# Patient Record
Sex: Female | Born: 1975 | Race: White | Hispanic: No | Marital: Married
Health system: Midwestern US, Community
[De-identification: ages and names within clinical notes are randomized; demographics above are authoritative.]

## PROBLEM LIST (undated history)

## (undated) DIAGNOSIS — E119 Type 2 diabetes mellitus without complications: Secondary | ICD-10-CM

## (undated) HISTORY — PX: BREAST SURGERY: SHX581

---

## 2015-07-19 ENCOUNTER — Emergency Department (HOSPITAL_COMMUNITY)
Admission: EM | Admit: 2015-07-19 | Discharge: 2015-07-20 | Disposition: A | Discharge status: Home / Self Care | Payer: BLUE CROSS/BLUE SHIELD | Attending: Emergency Medicine | Admitting: Emergency Medicine

## 2015-07-19 ENCOUNTER — Encounter (HOSPITAL_COMMUNITY): Payer: Self-pay | Admitting: Emergency Medicine

## 2015-07-19 DIAGNOSIS — Z3202 Encounter for pregnancy test, result negative: Secondary | ICD-10-CM | POA: Diagnosis not present

## 2015-07-19 DIAGNOSIS — R6883 Chills (without fever): Secondary | ICD-10-CM | POA: Insufficient documentation

## 2015-07-19 DIAGNOSIS — R51 Headache: Secondary | ICD-10-CM | POA: Diagnosis present

## 2015-07-19 DIAGNOSIS — R112 Nausea with vomiting, unspecified: Secondary | ICD-10-CM | POA: Diagnosis not present

## 2015-07-19 DIAGNOSIS — E119 Type 2 diabetes mellitus without complications: Secondary | ICD-10-CM | POA: Insufficient documentation

## 2015-07-19 DIAGNOSIS — R519 Headache, unspecified: Secondary | ICD-10-CM

## 2015-07-19 HISTORY — DX: Type 2 diabetes mellitus without complications: E11.9

## 2015-07-19 LAB — COMPREHENSIVE METABOLIC PANEL
ALBUMIN: 3.5 g/dL (ref 3.5–5.0)
ALT: 15 U/L (ref 14–54)
ANION GAP: 12 (ref 5–15)
AST: 24 U/L (ref 15–41)
Alkaline Phosphatase: 86 U/L (ref 38–126)
BILIRUBIN TOTAL: 0.7 mg/dL (ref 0.3–1.2)
BUN: 12 mg/dL (ref 6–20)
CO2: 24 mmol/L (ref 22–32)
Calcium: 8.5 mg/dL — ABNORMAL LOW (ref 8.9–10.3)
Chloride: 101 mmol/L (ref 101–111)
Creatinine, Ser: 1.26 mg/dL — ABNORMAL HIGH (ref 0.44–1.00)
GFR calc Af Amer: >60 mL/min (ref >60)
GFR calc non Af Amer: 53 mL/min — ABNORMAL LOW (ref >60)
GLUCOSE: 120 mg/dL — AB (ref 65–99)
POTASSIUM: 3.3 mmol/L — AB (ref 3.5–5.1)
Sodium: 137 mmol/L (ref 135–145)
TOTAL PROTEIN: 7.7 g/dL (ref 6.5–8.1)

## 2015-07-19 LAB — I-STAT BETA HCG BLOOD, ED (MC, WL, AP ONLY): I-stat hCG, quantitative: 8.9 m[IU]/mL — ABNORMAL HIGH (ref <5)

## 2015-07-19 LAB — CBC
HEMATOCRIT: 34.4 % — AB (ref 36.0–46.0)
HEMOGLOBIN: 11.3 g/dL — AB (ref 12.0–15.0)
MCH: 26.2 pg (ref 26.0–34.0)
MCHC: 32.8 g/dL (ref 30.0–36.0)
MCV: 79.8 fL (ref 78.0–100.0)
Platelets: 217 10*3/uL (ref 150–400)
RBC: 4.31 MIL/uL (ref 3.87–5.11)
RDW: 13.8 % (ref 11.5–15.5)
WBC: 19.1 10*3/uL — AB (ref 4.0–10.5)

## 2015-07-19 LAB — LIPASE, BLOOD: LIPASE: 14 U/L — AB (ref 22–51)

## 2015-07-19 NOTE — ED Provider Notes (Signed)
CSN: [id number]     Arrival date & time 07/19/15  2016 History  This chart was scribed for Terry Wurm, MD by Octavia Heir, ED Scribe. This patient was seen in room WA23/WA23 and the patient's care was started at 12:32 AM.    No chief complaint on file.     Patient is a 39 y.o. female presenting with headaches. The history is provided by the patient. No language interpreter was used.  Headache Pain location:  Frontal Quality:  Dull Radiates to:  Does not radiate Onset quality:  Gradual Timing:  Constant Progression:  Unchanged Context: not activity, not coughing and not straining   Relieved by:  Nothing Worsened by:  Nothing Ineffective treatments:  None tried Associated symptoms: nausea and vomiting   Associated symptoms: no abdominal pain, no congestion, no diarrhea, no dizziness, no facial pain, no fatigue, no focal weakness, no myalgias, no neck pain, no neck stiffness, no numbness, no paresthesias, no photophobia, no seizures, no sinus pressure, no sore throat, no URI and no weakness   Risk factors: does not have insomnia and lifestyle not sedentary    HPI Comments: Terry Dominguez is a 39 y.o. female who has a hx of DM presents to the Emergency Department complaining of multiple complaints onset 5 days ago. Pt has associated intermittent, gradual worsening nausea, headache and vomiting about 15 times since yesterday.  Pt notes her the first day of her last menstrual cycle was 11 days ago. Pt denies congestion, itchy/watery eyes, rhinorrhea, sick contacts, travel, abdominal pain, urinary frequency, constipation, diarrhea, dysuria, new medications, nor tick exposure.  No documented fevers no changes in vision, speech or cognition   Past Medical History  Diagnosis Date  . Diabetes mellitus without complication    Past Surgical History  Procedure Laterality Date  . Cesarean section    . Breast surgery      Reduction   No family history on file. Social History  Substance Use  Topics  . Smoking status: Never Smoker   . Smokeless tobacco: None  . Alcohol Use: No   OB History    No data available     Review of Systems  Constitutional: Positive for chills. Negative for fatigue.  HENT: Negative for congestion, rhinorrhea, sinus pressure, sore throat and voice change.   Eyes: Negative for photophobia, itching and visual disturbance.  Gastrointestinal: Positive for nausea and vomiting. Negative for abdominal pain, diarrhea, constipation and blood in stool.  Genitourinary: Negative for dysuria and frequency.  Musculoskeletal: Negative for myalgias, neck pain and neck stiffness.  Neurological: Positive for headaches. Negative for dizziness, focal weakness, seizures, facial asymmetry, weakness, numbness and paresthesias.  All other systems reviewed and are negative.     Allergies  Review of patient's allergies indicates no known allergies.  Home Medications   Prior to Admission medications   Not on File   Triage vitals: BP 112/65 mmHg  Pulse 107  Temp(Src) 98.2 F (36.8 C) (Oral)  Resp 20  SpO2 99%  LMP 06/18/2015 Physical Exam  Constitutional: She is oriented to person, place, and time. She appears well-developed and well-nourished. No distress.  Well appearing sitting in the room comfortably with all the lights on  HENT:  Head: Normocephalic.  Mouth/Throat: Oropharynx is clear and moist. No oropharyngeal exudate.  Eyes: Conjunctivae and EOM are normal. Pupils are equal, round, and reactive to light. No scleral icterus.  Neck: Normal range of motion. Neck supple. No thyromegaly present.  Trachea midline, no lymphadenopathy, no meningismus  Cardiovascular: Normal rate, regular rhythm and intact distal pulses.  Exam reveals no gallop and no friction rub.   No murmur heard. Pulmonary/Chest: Effort normal and breath sounds normal. No respiratory distress. She has no wheezes. She has no rales.  Lungs clear  Abdominal: Soft. She exhibits no distension  and no mass. Bowel sounds are increased. There is no tenderness. There is no rigidity, no rebound, no guarding, no tenderness at McBurney's point and negative Murphy's sign. No hernia.  Hyperactive bowel sounds throughout  Musculoskeletal: Normal range of motion.  Lymphadenopathy:    She has no cervical adenopathy.  Neurological: She is alert and oriented to person, place, and time. She has normal reflexes. She displays normal reflexes. No cranial nerve deficit. She exhibits normal muscle tone. Coordination normal.  Intact cognition  Skin: Skin is warm and dry. No rash noted.  Psychiatric: She has a normal mood and affect. Her behavior is normal.  Nursing note and vitals reviewed.   ED Course  Procedures  DIAGNOSTIC STUDIES: Oxygen Saturation is 99% on RA, normal by my interpretation.  COORDINATION OF CARE:  12:35 AM Discussed treatment plan which includes lab work with pt at bedside and pt agreed to plan.  Labs Review Labs Reviewed  LIPASE, BLOOD - Abnormal; Notable for the following:    Lipase 14 (*)    All other components within normal limits  COMPREHENSIVE METABOLIC PANEL - Abnormal; Notable for the following:    Potassium 3.3 (*)    Glucose, Bld 120 (*)    Creatinine, Ser 1.26 (*)    Calcium 8.5 (*)    GFR calc non Af Amer 53 (*)    All other components within normal limits  CBC - Abnormal; Notable for the following:    WBC 19.1 (*)    Hemoglobin 11.3 (*)    HCT 34.4 (*)    All other components within normal limits  I-STAT BETA HCG BLOOD, ED ([facility], [facility], [facility] ONLY) - Abnormal; Notable for the following:    I-stat hCG, quantitative 8.9 (*)    All other components within normal limits    Imaging Review No results found. I have personally reviewed and evaluated these images and lab results as part of my medical decision-making.   EKG Interpretation None      MDM   Final diagnoses:  None    White count likely to vomiting > 15 times, demargination.  There is no  pain in abdomen.  No indication for CT.  No fevers, no travel or tick exposure.  No documented fevers symptoms x several days. Intact cognition and cranial nerves normal CT of the head.  Highly doubt acute intracranial pathology.    I personally performed the services described in this documentation, which was scribed in my presence. The recorded information has been reviewed and is accurate.    Cy Blamer, MD 07/20/15 [phone]

## 2015-07-19 NOTE — ED Notes (Signed)
Pt c/o intermittent fever, N/V emesis x12 in 24 hours, HA, dizziness. Denies diarrhea.

## 2015-07-19 NOTE — ED Notes (Signed)
Pt c/o n/v/dfever/chills since Monday 8/15.  No diarrhea.  She currently has a headache rated 7/10 and described as throbbing over her right eye.  Pt is currently resting quietly in bed with family at the bedside.  RN explained the delay and offered services; pt denied any current needs. No acute distress noted at this time.

## 2015-07-20 ENCOUNTER — Emergency Department (HOSPITAL_COMMUNITY): Payer: BLUE CROSS/BLUE SHIELD

## 2015-07-20 ENCOUNTER — Encounter (HOSPITAL_COMMUNITY): Payer: Self-pay | Admitting: Emergency Medicine

## 2015-07-20 LAB — URINE MICROSCOPIC-ADD ON

## 2015-07-20 LAB — URINALYSIS, ROUTINE W REFLEX MICROSCOPIC
BILIRUBIN URINE: NEGATIVE
Glucose, UA: NEGATIVE mg/dL
NITRITE: NEGATIVE
Protein, ur: 30 mg/dL — AB
Specific Gravity, Urine: 1.018 (ref 1.005–1.030)
UROBILINOGEN UA: 1 mg/dL (ref 0.0–1.0)
pH: 6.5 (ref 5.0–8.0)

## 2015-07-20 LAB — HCG, QUANTITATIVE, PREGNANCY

## 2015-07-20 MED ORDER — METOCLOPRAMIDE HCL 5 MG/ML IJ SOLN
10.0000 mg | Freq: Once | INTRAMUSCULAR | Status: AC
  Administered 2015-07-20: 10 mg via INTRAVENOUS
  Filled 2015-07-20: qty 2

## 2015-07-20 MED ORDER — METOCLOPRAMIDE HCL 10 MG PO TABS: 10.0000 mg | ORAL_TABLET | Freq: Four times a day (QID) | ORAL | Status: DC

## 2015-07-20 MED ORDER — SODIUM CHLORIDE 0.9 % IV BOLUS (SEPSIS)
1000.0000 mL | Freq: Once | INTRAVENOUS | Status: AC
  Administered 2015-07-20: 1000 mL via INTRAVENOUS

## 2015-07-20 MED ORDER — KETOROLAC TROMETHAMINE 30 MG/ML IJ SOLN
30.0000 mg | Freq: Once | INTRAMUSCULAR | Status: AC
  Administered 2015-07-20: 30 mg via INTRAVENOUS
  Filled 2015-07-20: qty 1

## 2015-07-20 MED ORDER — SUCRALFATE 1 GM/10ML PO SUSP: 1.0000 g | Freq: Three times a day (TID) | ORAL | Status: DC

## 2015-07-20 NOTE — ED Notes (Signed)
Pt states she feels some better.

## 2015-07-20 NOTE — ED Notes (Signed)
Pt states she feels better.

## 2015-07-20 NOTE — ED Notes (Signed)
Room temperature ginger ale given per MD request

## 2016-02-10 IMAGING — CR DG ABDOMEN ACUTE W/ 1V CHEST
3 series · 3 of 3 positions shown · non-contrast
Comparison: None.

CLINICAL DATA: Acute onset of intermittent fever, nausea and
vomiting. Headache and dizziness. Initial encounter.

EXAM:
DG ABDOMEN ACUTE W/ 1V CHEST

[w chest pa]
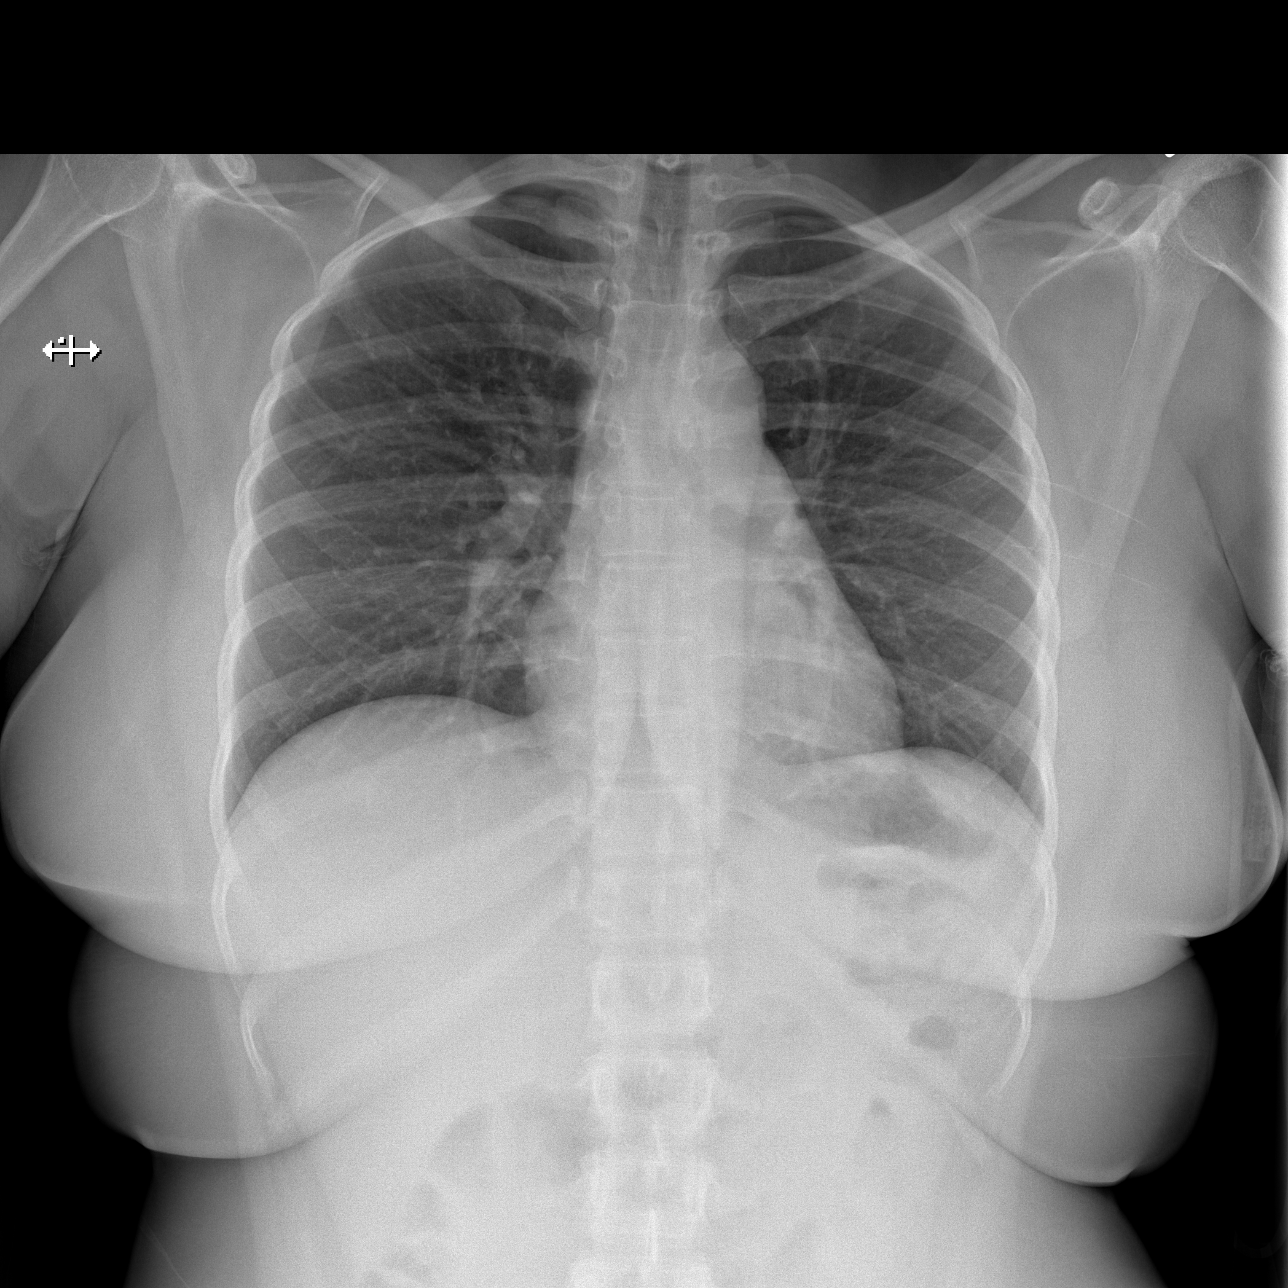

[w abdomen upright]
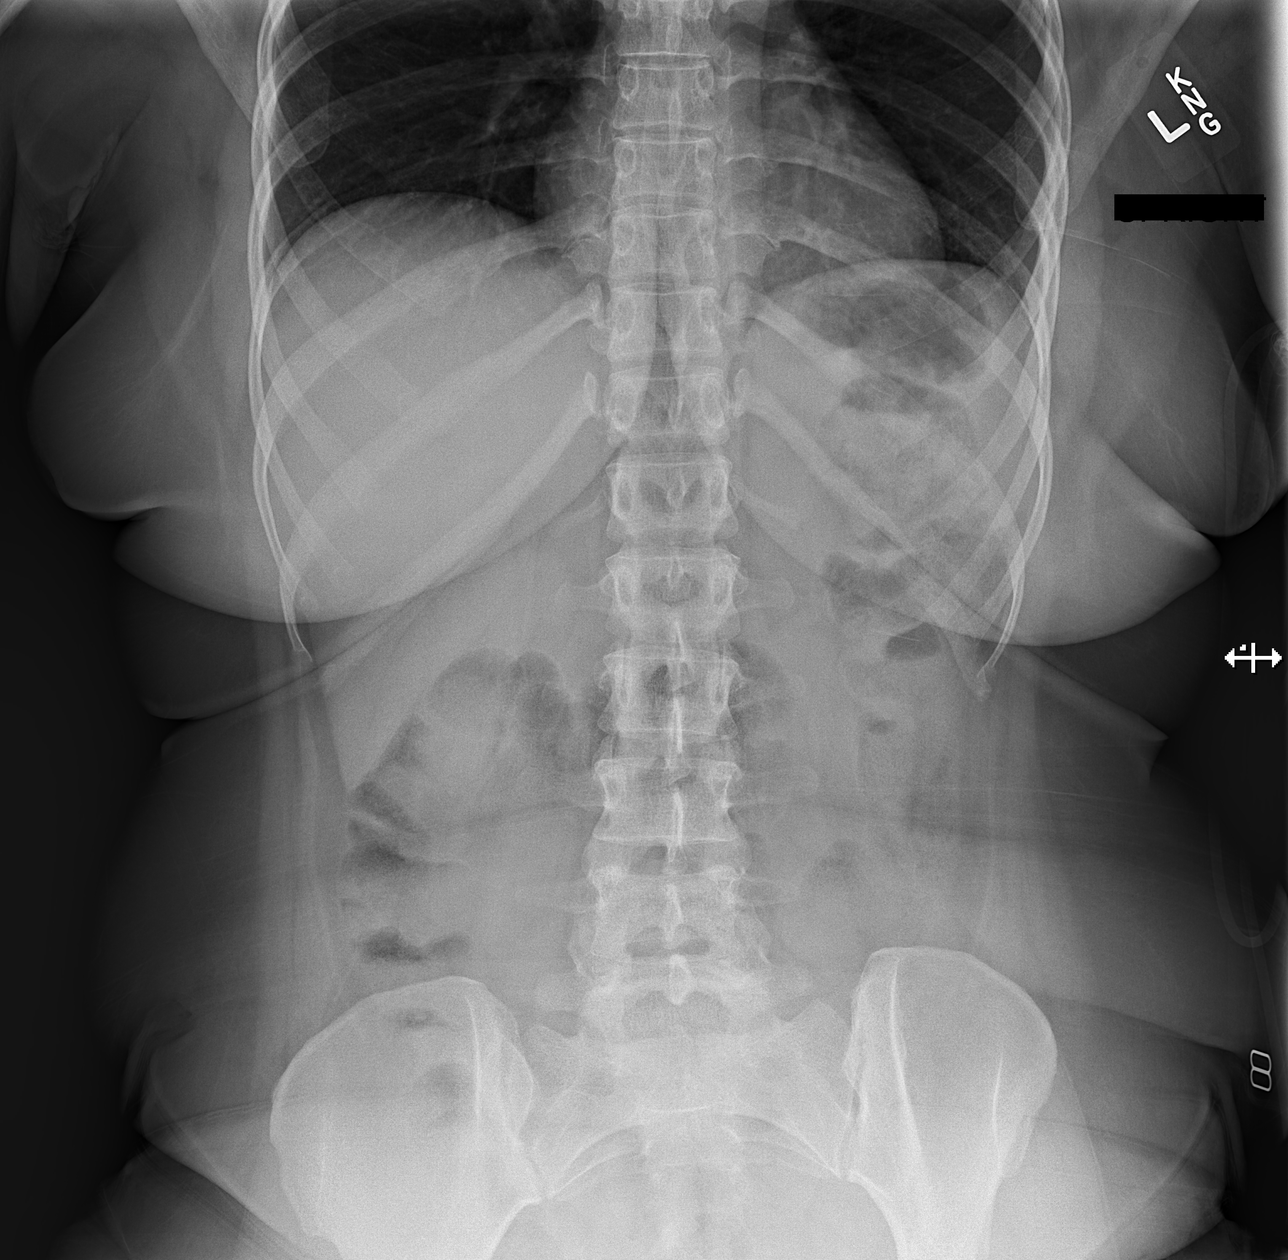

[t abdomen supine]
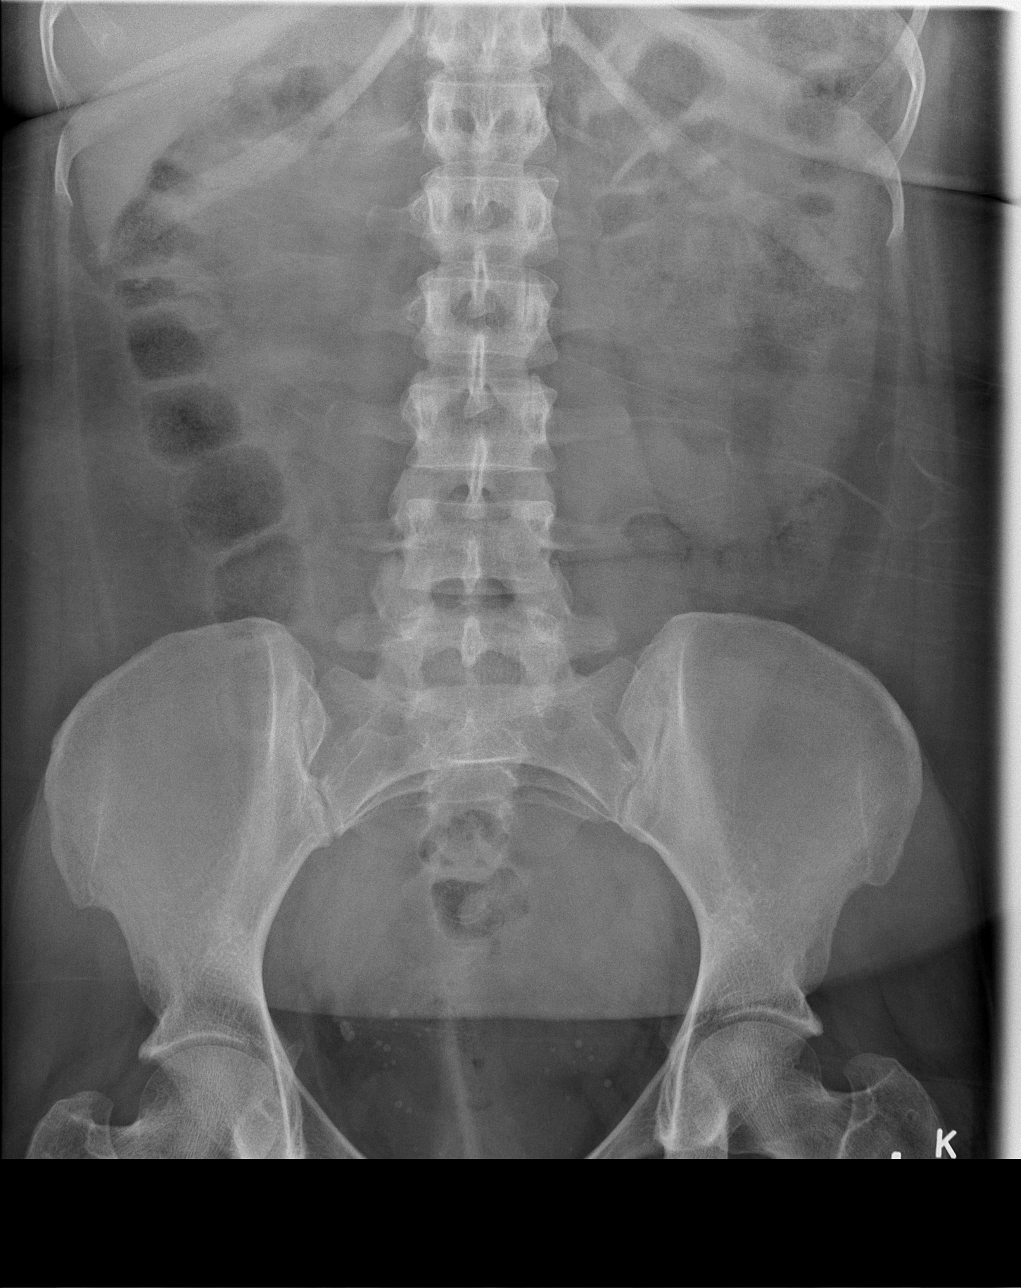

[3 of 3 positions shown; findings below may reference images not displayed]

FINDINGS: The lungs are well-aerated and clear. There is no evidence of focal
opacification, pleural effusion or pneumothorax. The
cardiomediastinal silhouette is within normal limits.

The visualized bowel gas pattern is unremarkable. Scattered stool
and air are seen within the colon; there is no evidence of small
bowel dilatation to suggest obstruction. No free intra-abdominal air
is identified on the provided upright view.

No acute osseous abnormalities are seen; the sacroiliac joints are
unremarkable in appearance.
IMPRESSION: 1. Unremarkable bowel gas pattern; no free intra-abdominal air seen.
Small to moderate amount of stool noted in the colon.
2. No acute cardiopulmonary process seen.

## 2016-03-02 NOTE — Telephone Encounter (Signed)
S-RPh is calling for patient requesting refill.  B-She is looking for Valtrex 1000 mg 4 x a day.   A-I checked the patient's chart and she has never gotten a Valtrex Rx from this office. I notified the RPh and she said the Rx came from another physician. She will notify the patient.   R-Call PCP within 24 hours.   Reason for Disposition  ??? Caller requesting a NON-URGENT new prescription or refill and triager unable to refill per unit policy    Protocols used: MEDICATION QUESTION CALL-ADULT-AH

## 2016-03-02 NOTE — Telephone Encounter (Signed)
Name of Caller: Shanda BumpsJessica    Relationship to Patient:pharmacist     Contact phone number: [phone]    Medication Name: Valtrex 1000 mg 4 a day     Ordering Physician: Lambert KetoBrinkman, Murray    Pharmacy Name:  [facility] - [place], [place] - [address] [place]- P [phone] F [phone]    Pharmacy Address:  see above    Pharmacy Phone Number: see above     Updated/Validated Preferred Pharmacy: Yes    Patient instructed to contact the pharmacy prior to picking up the medication: No    Patient advised that office/PCP has 24-48 business hours to return their call: No    Sent to nurse since pharmacist stated it was an urgent request

## 2016-10-26 ENCOUNTER — Encounter (HOSPITAL_COMMUNITY): Payer: Self-pay | Admitting: Emergency Medicine

## 2016-10-26 ENCOUNTER — Emergency Department (HOSPITAL_COMMUNITY)
Admission: EM | Admit: 2016-10-26 | Discharge: 2016-10-26 | Disposition: A | Discharge status: Home / Self Care | Payer: BLUE CROSS/BLUE SHIELD | Attending: Emergency Medicine | Admitting: Emergency Medicine

## 2016-10-26 DIAGNOSIS — Z79899 Other long term (current) drug therapy: Secondary | ICD-10-CM | POA: Insufficient documentation

## 2016-10-26 DIAGNOSIS — E1165 Type 2 diabetes mellitus with hyperglycemia: Secondary | ICD-10-CM | POA: Diagnosis not present

## 2016-10-26 DIAGNOSIS — R739 Hyperglycemia, unspecified: Secondary | ICD-10-CM

## 2016-10-26 LAB — BASIC METABOLIC PANEL
ANION GAP: 8 (ref 5–15)
BUN: 13 mg/dL (ref 6–20)
CALCIUM: 9.2 mg/dL (ref 8.9–10.3)
CO2: 23 mmol/L (ref 22–32)
CREATININE: 0.96 mg/dL (ref 0.44–1.00)
Chloride: 104 mmol/L (ref 101–111)
Glucose, Bld: 392 mg/dL — ABNORMAL HIGH (ref 65–99)
Potassium: 4 mmol/L (ref 3.5–5.1)
SODIUM: 135 mmol/L (ref 135–145)

## 2016-10-26 LAB — URINALYSIS, ROUTINE W REFLEX MICROSCOPIC
BILIRUBIN URINE: NEGATIVE
Glucose, UA: >1000 mg/dL — AB
HGB URINE DIPSTICK: NEGATIVE
Ketones, ur: NEGATIVE mg/dL
Leukocytes, UA: NEGATIVE
NITRITE: NEGATIVE
PROTEIN: NEGATIVE mg/dL
Specific Gravity, Urine: 1.033 — ABNORMAL HIGH (ref 1.005–1.030)
pH: 5.5 (ref 5.0–8.0)

## 2016-10-26 LAB — URINE MICROSCOPIC-ADD ON: RBC / HPF: NONE SEEN RBC/hpf (ref 0–5)

## 2016-10-26 LAB — CBC
HCT: 35.1 % — ABNORMAL LOW (ref 36.0–46.0)
HEMOGLOBIN: 11.1 g/dL — AB (ref 12.0–15.0)
MCH: 25.8 pg — AB (ref 26.0–34.0)
MCHC: 31.6 g/dL (ref 30.0–36.0)
MCV: 81.6 fL (ref 78.0–100.0)
PLATELETS: 290 10*3/uL (ref 150–400)
RBC: 4.3 MIL/uL (ref 3.87–5.11)
RDW: 13.8 % (ref 11.5–15.5)
WBC: 8.6 10*3/uL (ref 4.0–10.5)

## 2016-10-26 LAB — CBG MONITORING, ED
GLUCOSE-CAPILLARY: 387 mg/dL — AB (ref 65–99)
GLUCOSE-CAPILLARY: 150 mg/dL — AB (ref 65–99)

## 2016-10-26 MED ORDER — INSULIN ASPART 100 UNIT/ML ~~LOC~~ SOLN
9.0000 [IU] | Freq: Once | SUBCUTANEOUS | Status: AC
  Administered 2016-10-26: 9 [IU] via SUBCUTANEOUS
  Filled 2016-10-26: qty 1

## 2016-10-26 MED ORDER — GLIPIZIDE 5 MG PO TABS: 5.0000 mg | ORAL_TABLET | Freq: Every day | ORAL | 0 refills | Status: AC

## 2016-10-26 MED ORDER — SODIUM CHLORIDE 0.9 % IV BOLUS (SEPSIS)
1000.0000 mL | Freq: Once | INTRAVENOUS | Status: AC
  Administered 2016-10-26: 1000 mL via INTRAVENOUS

## 2016-10-26 MED ORDER — INSULIN ASPART 100 UNIT/ML IV SOLN: 9.0000 [IU] | Freq: Once | INTRAVENOUS | Status: DC

## 2016-10-26 NOTE — ED Provider Notes (Signed)
[facility]-EMERGENCY DEPT Provider Note   CSN: [id number] Arrival date & time: 10/26/16  0850     History   Chief Complaint Chief Complaint  Patient presents with  . Hyperglycemia    HPI Terry Dominguez is a 40 y.o. female who presents with hyperglycemia. Past medical history significant for type 2 diabetes with history of DKA. She states that she was first diagnosed with diabetes in 2010. She was previously on insulin however states that she managed to control her blood sugar with diet and when she went to a PCP in February a stated that she no longer needed insulin or any oral medications. She reports blurry vision, nausea, feeling thirsty, frequent urination. She has a glucose monitor which he checked was in the 400s. Fasting blood sugar was 106 and hemoglobin A1c was 6.0 therefore the provider felt that she did not need medications. Patient states that over the past several months she has had worsening diarrhea and has not been exercising due to worsening stress. She denies any fever, chills, chest pain, shortness of breath, cough, abdominal pain, vomiting, dysuria.  HPI  Past Medical History:  Diagnosis Date  . Diabetes mellitus without complication (HCC)     There are no active problems to display for this patient.   Past Surgical History:  Procedure Laterality Date  . BREAST SURGERY     Reduction  . CESAREAN SECTION      OB History    No data available       Home Medications    Prior to Admission medications   Medication Sig Start Date End Date Taking? Authorizing Provider  ibuprofen (ADVIL,MOTRIN) 200 MG tablet Take 200 mg by mouth every 6 (six) hours as needed for headache, mild pain or moderate pain.    Yes Historical Provider, MD    Family History No family history on file.  Social History Social History  Substance Use Topics  . Smoking status: Never Smoker  . Smokeless tobacco: Not on file  . Alcohol use No     Allergies   Patient has no known  allergies.   Review of Systems Review of Systems  Constitutional: Positive for activity change and appetite change. Negative for chills and fever.  Eyes: Positive for visual disturbance. Negative for pain.  Respiratory: Negative for cough and shortness of breath.   Cardiovascular: Negative for chest pain.  Gastrointestinal: Positive for nausea. Negative for diarrhea and vomiting.  Endocrine: Positive for polydipsia and polyuria.  Genitourinary: Positive for frequency. Negative for dysuria, flank pain and pelvic pain.  Neurological: Negative for numbness.  All other systems reviewed and are negative.    Physical Exam Updated Vital Signs BP 120/87 (BP Location: Left Arm)   Pulse 77   Temp 97.7 F (36.5 C) (Oral)   Resp 16   Ht 5\' 6"  (1.676 m)   Wt 86.2 kg   LMP 10/19/2016   SpO2 98%   BMI 30.67 kg/m   Physical Exam  Constitutional: She is oriented to person, place, and time. She appears well-developed and well-nourished. No distress.  Calm, cooperative, NAD  HENT:  Head: Normocephalic and atraumatic.  Eyes: Conjunctivae are normal. Pupils are equal, round, and reactive to light. Right eye exhibits no discharge. Left eye exhibits no discharge. No scleral icterus.  Neck: Normal range of motion.  Cardiovascular: Normal rate and regular rhythm.  Exam reveals no gallop and no friction rub.   No murmur heard. Pulmonary/Chest: Effort normal and breath sounds normal. No respiratory distress. She  has no wheezes. She has no rales. She exhibits no tenderness.  Abdominal: Soft. Bowel sounds are normal. She exhibits no distension and no mass. There is no tenderness. There is no rebound and no guarding. No hernia.  Neurological: She is alert and oriented to person, place, and time.  Skin: Skin is warm and dry.  Psychiatric: She has a normal mood and affect. Her behavior is normal.  Nursing note and vitals reviewed.    ED Treatments / Results  Labs (all labs ordered are listed, but  only abnormal results are displayed) Labs Reviewed  BASIC METABOLIC PANEL - Abnormal; Notable for the following:       Result Value   Glucose, Bld 392 (*)    All other components within normal limits  CBC - Abnormal; Notable for the following:    Hemoglobin 11.1 (*)    HCT 35.1 (*)    MCH 25.8 (*)    All other components within normal limits  URINALYSIS, ROUTINE W REFLEX MICROSCOPIC (NOT AT [facility]) - Abnormal; Notable for the following:    Specific Gravity, Urine 1.033 (*)    Glucose, UA >1000 (*)    All other components within normal limits  URINE MICROSCOPIC-ADD ON - Abnormal; Notable for the following:    Squamous Epithelial / LPF 0-5 (*)    Bacteria, UA MANY (*)    All other components within normal limits  CBG MONITORING, ED - Abnormal; Notable for the following:    Glucose-Capillary 387 (*)    All other components within normal limits  CBG MONITORING, ED - Abnormal; Notable for the following:    Glucose-Capillary 150 (*)    All other components within normal limits  CBG MONITORING, ED    EKG  EKG Interpretation None       Radiology No results found.  Procedures Procedures (including critical care time)  Medications Ordered in ED Medications  sodium chloride 0.9 % bolus 1,000 mL (0 mLs Intravenous Stopped 10/26/16 1402)  sodium chloride 0.9 % bolus 1,000 mL (0 mLs Intravenous Stopped 10/26/16 1402)  insulin aspart (novoLOG) injection 9 Units (9 Units Subcutaneous Given 10/26/16 1208)     Initial Impression / Assessment and Plan / ED Course  I have reviewed the triage vital signs and the nursing notes.  Pertinent labs & imaging results that were available during my care of the patient were reviewed by me and considered in my medical decision making (see chart for details).  Clinical Course    40 year old female presents with hyperglycemia with mild dehydration without evidence of DKA. Patient is afebrile, not tachycardic or tachypneic, normotensive, and not  hypoxic. Initial CBG is 387. Labs overall unremarkable other than mild anemia. UA remarkable for >1000 glucose with 1.033 specific gravity. 2L IVF and insulin given.  On recheck CBG is 150. CM came to speak with patient who provided resources for establishment of PCP. Rx for Glipizide #30 given since she states she has intolerance to Metformin. Patient is NAD, non-toxic, with stable VS. Patient is informed of clinical course, understands medical decision making process, and agrees with plan. Opportunity for questions provided and all questions answered. Return precautions given.   Final Clinical Impressions(s) / ED Diagnoses   Final diagnoses:  Hyperglycemia    New Prescriptions New Prescriptions   No medications on file     Bethel BornKelly Marie Vestal Markin, PA-C 10/26/16 1517    Maia PlanJoshua G Long, MD 10/27/16 [phone]

## 2016-10-26 NOTE — Progress Notes (Addendum)
ED CM contacted by ED Garnette Gunnerlerk Lisa about ED CM consult ED CM reviewed pt chart Pt has [employer] coverage CM found a list of in network pcp/internal medicine providers for pt to follow up with  Cm spoke with pt and female at bedside [place] and has not looked for a provider CM discussed with pt choice and options Provided pt with a 10 page list of providers from [facility] for [place] in [place] that are listed as accepting new [employer]BS patients to choose a provider and make an appointment per her schedule Discussed with her that her ED PA/NP felt it is important that she follow up with a pcp after ED d/c  ED RN in room obtaining an IV access  Pt in agreement with choice

## 2016-10-26 NOTE — ED Notes (Signed)
PT DISCHARGED. INSTRUCTIONS AND PRESCRIPTION GIVEN. AAOX4. PT IN NO APPARENT DISTRESS OR PAIN. THE OPPORTUNITY TO ASK QUESTIONS WAS PROVIDED. 

## 2016-10-26 NOTE — ED Triage Notes (Signed)
Pt c/o high blood sugar. Pt sts she recently relocated. She had been told by a doctor in previous city that she was a diabetic and was regularly taking insulin. Pt sts when she moved here she was told she wasn'ta  Diabetic so they didn't give her an insulin prescription. Pt sts she has been checking her sugars that past few days and they have been in the 300s. Pt denies abdominal pain. C/o Nausea. A&Ox4 and ambulatory. Pt sts she had some novolog left over and took a dose a couple of days ago.

## 2016-12-29 ENCOUNTER — Ambulatory Visit: Payer: BLUE CROSS/BLUE SHIELD | Admitting: Dietician

## 2017-08-05 ENCOUNTER — Inpatient Hospital Stay: Admit: 2017-08-05 | Attending: Family Medicine | Primary: Family Medicine

## 2017-08-05 NOTE — Other (Unsigned)
Patient Acct Nbr: [id number]   Primary AUTH/CERT:   Primary Insurance Company Name: [employer] [facility]  Primary Insurance Plan name: [employer]ley [employer]  Primary Insurance Group Number: [id number]  Primary Insurance Plan Type: Health  Primary Insurance Policy Number: [id number]

## 2018-08-08 ENCOUNTER — Inpatient Hospital Stay: Admit: 2018-08-08 | Attending: Family Medicine | Primary: Family Medicine

## 2018-08-08 NOTE — Other (Unsigned)
Patient Acct Nbr: [id number]   Primary AUTH/CERT:   Primary Insurance Company Name: [employer]achael [employer] [facility]  Primary Insurance Plan name: Oley Balm  Primary Insurance Group Number: [id number]  Primary Insurance Plan Type: Health  Primary Insurance Policy Number: [id number]

## 2019-07-24 LAB — TSH: TSH: 1.89 u[IU]/mL (ref 0.465–4.680)

## 2019-07-24 NOTE — Other (Unsigned)
Patient Acct Nbr: [id number]   Primary AUTH/CERT:   Primary Insurance Company Name: [employer]ael [employer] [facility]  Primary Insurance Plan name: Oley Balm  Primary Insurance Group Number: [id number]  Primary Insurance Plan Type: Health  Primary Insurance Policy Number: [id number]

## 2019-08-11 ENCOUNTER — Inpatient Hospital Stay: Admit: 2019-08-11 | Attending: Family Medicine | Primary: Family Medicine

## 2019-08-11 NOTE — Other (Unsigned)
Patient Acct Nbr: [id number]   Primary AUTH/CERT:   Primary Insurance Company Name: Rachael [employer] Cross [facility]  Primary Insurance Plan name: Oley Balm  Primary Insurance Group Number: [id number]  Primary Insurance Plan Type: Health  Primary Insurance Policy Number: [id number]

## 2020-08-15 ENCOUNTER — Inpatient Hospital Stay: Admit: 2020-08-15 | Attending: Family Medicine | Primary: Family Medicine

## 2021-08-21 ENCOUNTER — Inpatient Hospital Stay: Admit: 2021-08-21 | Attending: Family Medicine | Primary: Family Medicine

## 2021-08-21 NOTE — Other (Unsigned)
Patient Acct Nbr: [id number]   Primary AUTH/CERT:   Primary Insurance Company Name: [employer]achael Fee Cross [facility]  Primary Insurance Plan name: Oley Balm  Primary Insurance Group Number: [id number]  Primary Insurance Plan Type: Health  Primary Insurance Policy Number: [id number]
